# Patient Record
Sex: Female | Born: 1975 | Hispanic: Refuse to answer | Marital: Married
Health system: Midwestern US, Academic
[De-identification: ages and names within clinical notes are randomized; demographics above are authoritative.]

---

## 2019-06-23 ENCOUNTER — Encounter: Admit: 2019-06-23 | Discharge: 2019-06-23 | Primary: Family

## 2019-06-23 DIAGNOSIS — G43909 Migraine, unspecified, not intractable, without status migrainosus: Secondary | ICD-10-CM

## 2019-06-23 DIAGNOSIS — E039 Hypothyroidism, unspecified: Secondary | ICD-10-CM

## 2019-07-09 ENCOUNTER — Ambulatory Visit: Admit: 2019-07-09 | Discharge: 2019-07-10 | Primary: Family

## 2019-07-09 ENCOUNTER — Encounter: Admit: 2019-07-09 | Discharge: 2019-07-09 | Primary: Family

## 2019-07-09 DIAGNOSIS — G43909 Migraine, unspecified, not intractable, without status migrainosus: Secondary | ICD-10-CM

## 2019-07-09 DIAGNOSIS — E039 Hypothyroidism, unspecified: Secondary | ICD-10-CM
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
## 2019-07-10 DIAGNOSIS — F0781 Postconcussional syndrome: Principal | ICD-10-CM

## 2019-07-10 DIAGNOSIS — F329 Major depressive disorder, single episode, unspecified: Secondary | ICD-10-CM

## 2019-07-10 DIAGNOSIS — F419 Anxiety disorder, unspecified: Secondary | ICD-10-CM

## 2019-07-10 DIAGNOSIS — R4689 Other symptoms and signs involving appearance and behavior: Secondary | ICD-10-CM

## 2019-07-10 DIAGNOSIS — R4189 Other symptoms and signs involving cognitive functions and awareness: Secondary | ICD-10-CM

## 2019-07-10 DIAGNOSIS — G44311 Acute post-traumatic headache, intractable: Secondary | ICD-10-CM

## 2019-07-14 ENCOUNTER — Encounter: Admit: 2019-07-14 | Discharge: 2019-07-14 | Primary: Family

## 2019-07-14 DIAGNOSIS — E039 Hypothyroidism, unspecified: Secondary | ICD-10-CM

## 2019-07-21 ENCOUNTER — Encounter: Admit: 2019-07-21 | Discharge: 2019-07-21 | Primary: Family
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
## 2019-07-27 ENCOUNTER — Encounter: Admit: 2019-07-27 | Discharge: 2019-07-27 | Primary: Family

## 2019-07-27 ENCOUNTER — Ambulatory Visit: Admit: 2019-07-27 | Discharge: 2019-07-28 | Payer: Worker's Compensation | Primary: Family

## 2019-07-27 DIAGNOSIS — R4689 Other symptoms and signs involving appearance and behavior: Secondary | ICD-10-CM

## 2019-07-27 DIAGNOSIS — F0781 Postconcussional syndrome: Secondary | ICD-10-CM

## 2019-07-27 DIAGNOSIS — F419 Anxiety disorder, unspecified: Secondary | ICD-10-CM

## 2019-07-27 DIAGNOSIS — E039 Hypothyroidism, unspecified: Secondary | ICD-10-CM

## 2019-07-27 DIAGNOSIS — R4189 Other symptoms and signs involving cognitive functions and awareness: Secondary | ICD-10-CM

## 2019-07-27 MED ORDER — VENLAFAXINE 75 MG PO CP24
75 mg | ORAL_CAPSULE | Freq: Every day | ORAL | 5 refills | Status: DC
Start: 2019-07-27 — End: 2019-09-21

## 2019-07-27 MED ORDER — VENLAFAXINE 37.5 MG PO CP24
37.5 mg | ORAL_CAPSULE | Freq: Every day | ORAL | 0 refills | Status: AC
Start: 2019-07-27 — End: ?

## 2019-07-27 NOTE — Progress Notes
Subjective:       History of Present Illness  Shari Mooney is a 43 y.o. female who presents with her husband for follow up.  She reports that she stopped the trazodone after I last saw her and has not noticed any difference in her headaches or sleep.  She still uses the sumatriptan and it does help.  She continues to have daily headaches.  She continues to report difficulty sleeping due to racing thoughts.       Review of Systems   Constitutional: Positive for activity change and fatigue.   HENT: Positive for ear pain, tinnitus and trouble swallowing.    Eyes: Positive for photophobia.   Gastrointestinal: Positive for diarrhea and nausea.   Musculoskeletal: Positive for myalgias.   Neurological: Positive for dizziness, tremors, speech difficulty, weakness, light-headedness, numbness and headaches.   Psychiatric/Behavioral: Positive for agitation, confusion, decreased concentration, dysphoric mood and sleep disturbance. The patient is nervous/anxious.    All other systems reviewed and are negative.        Objective:         ??? levothyroxine (SYNTHROID) 88 mcg tablet Take 88 mcg by mouth daily 30 minutes before breakfast.   ??? sumatriptan succinate (IMITREX) 100 mg tablet Take 100 mg by mouth as Needed for Migraine symptoms. Dose may be repeated in 2 hours if needed. Max of 2 tablets in 24 hours.   ??? traZODone (DESYREL) 50 mg tablet Take 25 mg by mouth at bedtime daily.     Vitals:    07/27/19 1616   Weight: 63.5 kg (140 lb)   Height: 162.6 cm (64)   PainSc: Three     Body mass index is 24.03 kg/m???.     Physical Exam  Vitals signs reviewed.   Constitutional:       Appearance: Normal appearance.   HENT:      Head: Normocephalic and atraumatic.   Cardiovascular:      Rate and Rhythm: Normal rate and regular rhythm.   Musculoskeletal:         General: No swelling.   Neurological:      Mental Status: She is alert.           Neuro Exam:  Mental Status: A&Ox3  Speech: fluent without dysarthria CN: visual fields full to confrontation, PERRLA, EOMI, strength of facial muscles is full and symmetric, hearing intact to conversation, symmetric palate elevation, tongue midline  Motor: normal tone/bulk, strength 5/5 throughout  Coordination: normal finger to nose bilaterally  Gait: normal    Concussion Symptom Checklist Score:  Concussion Symptom Checklist 07/28/2019 07/09/2019   Headache 5 4   Balance Problem 0 1   Dizziness 0 2   Fatigue 3 4   Trouble Falling Asleep 5 6   Sleeping more than usual 4 2   Sleeping less than usual 4 5   Drowsiness 4 4   Sensitivity To Light 1 1   Sensitivity To Noise 1 2   Irritability 4 5   Sadness 4 5   Nervousness 5 6   More Emotional 4 5   Numbness or tingling and/or neck pain 0 4   Feeling Slowed Down 5 5   Feeling Mentally Foggy 4 5   Difficulty Concentrating 4 5   Difficult Remembering 4 5   Visual Problems 1 3   Neck Pain 0 1   Total Symptoms Score 63 81     Total Symptoms Score: 63    Assessment and Plan:  1. Post  concussion syndrome  AMB REFERRAL TO PSYCHOLOGY    AMB REFERRAL TO SPEECH THERAPY   2. Intractable acute post-traumatic headache     3. Cognitive and behavioral changes  AMB REFERRAL TO PSYCHOLOGY    AMB REFERRAL TO SPEECH THERAPY   4. Anxiety and depression  AMB REFERRAL TO PSYCHOLOGY     Ms. Shari Mooney has continued daily headaches.  She also has cognitive changes and anxiety/depression.    Plan:  1. Headache abortive: continue sumatriptan 100mg  q2h prn--provided a refill today  2. Headache preventives  ? Previous trials: amitriptyline (effective, side effects), topamax (ineffective, side effects), indomethacin (effective, side effects), magnesium (ineffective)  ? Current medications: none  ? Start Effexor XR 37.5mg  qday for 2 weeks and then increase to 75mg  qday.  This is to treat headaches but may also help mood.    ? Future considerations: botox or CGRP antagonists.  These could end up being the best options given her issues with side effects with multiple medications.  3. Referral speech/cognitive therapy  4. Referral for psychology    Work status: full duty, no restrictions    RTC in 8 weeks

## 2019-07-28 ENCOUNTER — Encounter: Admit: 2019-07-28 | Discharge: 2019-07-28 | Primary: Family

## 2019-07-28 DIAGNOSIS — G44311 Acute post-traumatic headache, intractable: Principal | ICD-10-CM

## 2019-07-28 DIAGNOSIS — E039 Hypothyroidism, unspecified: Secondary | ICD-10-CM

## 2019-07-28 DIAGNOSIS — F329 Major depressive disorder, single episode, unspecified: Secondary | ICD-10-CM

## 2019-07-28 MED ORDER — SUMATRIPTAN SUCCINATE 100 MG PO TAB
100 mg | ORAL_TABLET | ORAL | 5 refills | Status: DC | PRN
Start: 2019-07-28 — End: 2019-11-23

## 2019-08-13 ENCOUNTER — Encounter: Admit: 2019-08-13 | Discharge: 2019-08-13 | Primary: Family
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
## 2019-09-21 ENCOUNTER — Encounter: Admit: 2019-09-21 | Discharge: 2019-09-21 | Primary: Family

## 2019-09-21 ENCOUNTER — Ambulatory Visit: Admit: 2019-09-21 | Discharge: 2019-09-21 | Payer: Worker's Compensation | Primary: Family

## 2019-09-21 DIAGNOSIS — E039 Hypothyroidism, unspecified: Secondary | ICD-10-CM

## 2019-09-21 MED ORDER — GALCANEZUMAB-GNLM 120 MG/ML SC PNIJ
240 mg | Freq: Once | SUBCUTANEOUS | 0 refills | Status: AC
Start: 2019-09-21 — End: ?
  Filled 2019-09-29: qty 2, 30d supply, fill #1

## 2019-09-21 MED ORDER — GALCANEZUMAB-GNLM 120 MG/ML SC PNIJ
120 mg | SUBCUTANEOUS | 11 refills | Status: AC
Start: 2019-09-21 — End: ?

## 2019-09-21 NOTE — Progress Notes
A prior authorization for [employer] (med name) is pending for [employer].  The following additional information is required to complete the prior authorization:    chart notes incomplete. unable to submit pa without most recent visit notes.    The specialty pharmacy team will contact the provider's nurse to notify them of the above requirements if not complete in 24 hours.  The specialty team will submit the prior authorization once all of the information is complete.    [place] Patient Advocate  [phone]

## 2019-09-22 ENCOUNTER — Encounter: Admit: 2019-09-22 | Discharge: 2019-09-22 | Primary: Family

## 2019-09-22 DIAGNOSIS — E039 Hypothyroidism, unspecified: Secondary | ICD-10-CM

## 2019-09-25 ENCOUNTER — Encounter: Admit: 2019-09-25 | Discharge: 2019-09-25 | Primary: Family

## 2019-09-25 NOTE — Progress Notes
The Prior Authorization for [facility] was denied for [employer].  The appeal materials have been sent to the ambulatory clinical pharmacist and provider's nurse via email to complete.  Completed appeal materials should be faxed to [phone] per insurance instructions.  Please contact the specialty pharmacy with any questions regarding next steps.    [id number]    [place] Patient Advocate  [phone]

## 2019-09-28 ENCOUNTER — Encounter: Admit: 2019-09-28 | Discharge: 2019-09-28 | Primary: Family

## 2019-09-28 NOTE — Progress Notes
Pharmacy Initial Medication Assessment: Calcitonin Gene-Related Peptide (CGRP) Receptor Antagonist    Indication / Regimen   Emgality (Galcanezumab-gnlm) is being used for the appropriate indication of migraine prevention.    The regimen of 240 mg subcutaneously as a single loading dose, followed by 120 mg once monthly is planned to continue indefinitely, which is appropriate for [employer]. No renal or hepatic adjustments are required for this medication.  No additional dose titration is required.     The patient has the ability to self-administer the medication.     Therapeutic Goals: reduce frequency, severity, and duration of migraine headaches/days, avoid side effects of treatment    Baseline Characteristics  Headache days per month: 8  Headache severity: 4 to 5, but up to 10 out of 10  Current agents: venlafaxine, sumatriptan  Previously trialed agents for this indication: trazodone, amitriptyline (effective, side effects), topamax (ineffective, side effects), indomethacin (effective, side effects), magnesium (ineffective)    Allergies   Allergies   Allergen Reactions   ? Amitriptyline SEE COMMENTS     Numbness in face, feels funny   ? Morphine ITCHING   ? Propoxyphene Hcl UNKNOWN   ? Topamax [Topiramate] SEE COMMENTS     Numbness in face(?)        Past Medical History  Medical History:   Diagnosis Date   ? Hypothyroid        Vaccination Status Assessment     There is no immunization history on file for this patient.  Vaccine history was reviewed with the patient. The patient was reminded about the importance of receiving an annual influenza vaccine as indicated.     Pregnancy Status  Pregnancy status was assessed and determined to be: Female, not of child-bearing potential: education not applicable.     Medication Reconciliation  Medication reconciliation is based on the patient?s most recent med list in electronic medical record (EMR) including herbal products and OTC medications. The patients? medication list will be updated during patient education, after speaking with the patient and prior to dispensing the medication.     Home Medications    Medication Sig   galcanezumab-gnlm (EMGALITY) 120 mg/mL subcutaneous PEN Inject 2 mL under the skin once for 1 dose.   galcanezumab-gnlm (EMGALITY) 120 mg/mL subcutaneous PEN Inject 1 mL under the skin every 30 days.   levothyroxine (SYNTHROID) 88 mcg tablet Take 88 mcg by mouth daily 30 minutes before breakfast.   sumatriptan succinate (IMITREX) 100 mg tablet Take one tablet by mouth as Needed for Migraine symptoms. Dose may be repeated in 2 hours if needed. Max of 2 tablets in 24 hours.       Drug-Drug Interactions  No new significant drug-drug or drug-food interactions were identified.    Drug-Food Interactions  There are no significant drug-food interactions. This medication can be taken with or without food.      Contraindications  Loral Richard Miu has no contraindications to this medication.    Safety Precautions  Safety precautions were evaluated and discussed with patient as applicable.     Risk Evaluation and Mitigation Strategy (REMS) Assessment  No REMS program is required for this medication.     Follow-up Plan   An initial medication assessment has been completed and the patient will be contacted for education.     Ladona Mow, [facility]        Specialty Medication Counseling     Blessed Richard Miu was contacted via telephone to provide medication education on their new specialty medication.  Krissa Richard Miu was educated on the medication Emgality (galcanezumab) along with the medication class (CGRP receptor antagonist). The indication for treatment, dose, route, frequency, and duration were discussed with the patient. The patient was educated on timely administration of therapy and management of missed doses. Adherence with therapy was discussed with the patient. The patient's ability to be adherent with drug therapies was discussed and the patient was provided options for tools/resources that promote adherence to therapy.     The patient's ability to self-administer the medication was assessed.  The patient was educated on proper administration/injection technique for their therapy and verbalized acceptance and understanding. Appropriate safe-handling, storage, and disposal directions were reviewed with the patient.     Contraindications to therapy, safety precautions, and common side effects were discussed with the patient. They were instructed to seek medical attention immediately if they experience signs of an allergic reaction, including but not limited to: a rash; hives; itching; red, swollen, blistered, or peeling skin with or without fever.     Requirements of the REMS program were discussed with the patient as applicable.     A medication history and reconciliation was performed (including prescription medications, supplements, over the counter, and herbal products). The medication list was updated and the patient's current medication list is listed below.     Home Medications    Medication Sig   galcanezumab-gnlm (EMGALITY) 120 mg/mL subcutaneous PEN Inject 2 mL under the skin once for 1 dose.   galcanezumab-gnlm (EMGALITY) 120 mg/mL subcutaneous PEN Inject 1 mL under the skin every 30 days.   levothyroxine (SYNTHROID) 88 mcg tablet Take 88 mcg by mouth daily 30 minutes before breakfast.   sumatriptan succinate (IMITREX) 100 mg tablet Take one tablet by mouth as Needed for Migraine symptoms. Dose may be repeated in 2 hours if needed. Max of 2 tablets in 24 hours.        Drug-drug and drug-food interactions with the new therapy were assessed and reviewed with the patient. The patient was instructed to speak with their health care provider before starting any new drug, including prescription or over the counter, natural products, or vitamins. Pregnancy potential was reviewed with the patient. Female, not of child-bearing potential: education not applicable.     Appropriate recommended vaccinations were reviewed and discussed with the patient.    The monitoring and follow-up plan was discussed with the patient. The patient was instructed to contact their health care provider if their symptoms or health problems do not get better or if they become worse. Charae Richard Miu was instructed to contact the specialty pharmacy at [phone] if they have any questions or concerns regarding their medication therapy.     Based on the patients' preference the medication(s) will be picked up or shipped from [facility].     Priya Richard Miu was given the opportunity to ask questions and did not have any questions at this time.    Ladona Mow, PHARMD

## 2019-10-06 ENCOUNTER — Encounter: Admit: 2019-10-06 | Discharge: 2019-10-06 | Primary: Family

## 2019-10-12 ENCOUNTER — Encounter: Admit: 2019-10-12 | Discharge: 2019-10-12 | Primary: Family

## 2019-10-20 ENCOUNTER — Encounter: Admit: 2019-10-20 | Discharge: 2019-10-20 | Primary: Family

## 2019-10-26 ENCOUNTER — Encounter: Admit: 2019-10-26 | Discharge: 2019-10-26 | Primary: Family

## 2019-10-26 NOTE — Telephone Encounter
Pharmacy Medication Reassessment: Calcitonin Gene-Related Peptide (CGRP) Receptor Antagonist    Appropriateness of Therapy   Emgality (Galcanezumab-gnlm) is being used for the appropriate indication of migraine prevention.    The regimen of 240 mg subcutaneously as a single loading dose, followed by 120 mg once monthly is planned to continue indefinitely which is appropriate for Shari Mooney. No renal or hepatic adjustments are required for this medication.No additional dose titration is required.     Therapeutic Goals and Response to Therapy  Patient Assessments:     Headache days per month: her general headaches that are 3 to 5 in severity have subsided, however migraine has not improved  Headache severity: migraine has not changed out of 10  Shari Mooney reports that she is sleeping much better now also.    As the patient is achieving therapeutic benefit from the therapy the plan is to continue.     Adverse Effects  Shari Mooney is having the following adverse effect(s) some injection site reaction that subsided within 2 days. The following strategies were discussed with the patient to mitigate the adverse effect(s) ice pack, bath, acetaminophen. The adverse effect(s) were not severe enough to interfere with adherence    Adherence  Refill and adherence history were reviewed with the patient. The patient is adherent with refills and is meeting their refill goal. They report no missed doses over the past 30 days.  The patient is meeting their adherence goal. The patient was reminded about the refill process and re-educated on the importance of adherence.    Allergies   Allergies   Allergen Reactions   ? Amitriptyline SEE COMMENTS     Numbness in face, feels funny   ? Morphine ITCHING   ? Propoxyphene Hcl UNKNOWN   ? Topamax [Topiramate] SEE COMMENTS     Numbness in face(?)        Vaccination Status Assessment     There is no immunization history on file for this patient. Vaccine history was reviewed with the patient. The patient was reminded about the importance of receiving an annual influenza vaccine as indicated.     Medication Reconciliation  A medication history and reconciliation were performed (including prescription medications, supplements, over the counter, and herbal products). The medication list was updated and the patients? current medication list is included below.     Home Medications    Medication Sig   galcanezumab-gnlm (EMGALITY) 120 mg/mL subcutaneous PEN Inject 1 mL under the skin every 30 days.   levothyroxine (SYNTHROID) 88 mcg tablet Take 88 mcg by mouth daily 30 minutes before breakfast.   sumatriptan succinate (IMITREX) 100 mg tablet Take one tablet by mouth as Needed for Migraine symptoms. Dose may be repeated in 2 hours if needed. Max of 2 tablets in 24 hours.        Drug-drug and drug-food interactions between the patients? specialty medication and their medication list were assessed and reviewed with the patient. The patient was instructed to speak with their health care provider before starting any new drugs, including prescription or over the counter, natural / herbal products, or vitamins.    No new significant drug-drug or drug-food interactions were identified.    Their regimen can be taken with or without food.    Pregnancy Status  Pregnancy status was assessed and determined to be: Female, not of child-bearing potential: education not applicable.     Risk Evaluation and Mitigation Strategy (REMS) Assessment  No REMS program is required for this medication.  Follow-up Plan   Shari Mooney was given the opportunity to ask questions and did not have any questions at this time. The patient was encouraged to call with questions. The patient will be contacted to complete another reassessment within 1 year.     Shari Mooney, PHARMD

## 2019-10-27 ENCOUNTER — Encounter: Admit: 2019-10-27 | Discharge: 2019-10-27 | Primary: Family

## 2019-10-27 MED FILL — GALCANEZUMAB-GNLM 120 MG/ML SC PNIJ: 120 mg/mL | SUBCUTANEOUS | 30 days supply | Qty: 1 | Fill #1 | Status: AC

## 2019-11-23 ENCOUNTER — Encounter: Admit: 2019-11-23 | Discharge: 2019-11-23 | Primary: Family

## 2019-11-23 ENCOUNTER — Ambulatory Visit: Admit: 2019-11-23 | Discharge: 2019-11-24 | Payer: Worker's Compensation | Primary: Family

## 2019-11-23 DIAGNOSIS — G44311 Acute post-traumatic headache, intractable: Secondary | ICD-10-CM

## 2019-11-23 DIAGNOSIS — R4189 Other symptoms and signs involving cognitive functions and awareness: Secondary | ICD-10-CM

## 2019-11-23 DIAGNOSIS — E039 Hypothyroidism, unspecified: Secondary | ICD-10-CM

## 2019-11-23 DIAGNOSIS — F0781 Postconcussional syndrome: Secondary | ICD-10-CM

## 2019-11-23 DIAGNOSIS — F419 Anxiety disorder, unspecified: Secondary | ICD-10-CM

## 2019-11-23 MED ORDER — SUMATRIPTAN SUCCINATE 100 MG PO TAB
ORAL_TABLET | SUBCUTANEOUS | 5 refills | 30.00000 days | Status: AC
Start: 2019-11-23 — End: ?

## 2019-11-23 NOTE — Progress Notes
Date of Service: 11/23/2019    Subjective:             Shari Mooney is a 43 y.o. female.    History of Present Illness  Shari Mooney returns for follow up.  She reports that she is so much better.  The speech therapy is going well.  She also feels that her anxiety is better after starting EMDR.  She feels that she is getting her energy back.  Her headaches are much better.  She reports days where she does not notice a headache.  She still has some mild headaches that are stimulated by increased cognitive activity or stress.  She has only had 3 full migraines in the last month.       Review of Systems   Neurological: Positive for headaches.   Psychiatric/Behavioral: Positive for sleep disturbance. The patient is nervous/anxious.    All other systems reviewed and are negative.        Objective:         ? galcanezumab-gnlm (EMGALITY) 120 mg/mL subcutaneous PEN Inject 1 mL under the skin every 30 days.   ? levothyroxine (SYNTHROID) 88 mcg tablet Take 88 mcg by mouth daily 30 minutes before breakfast.   ? sumatriptan succinate (IMITREX) 100 mg tablet Take one tablet by mouth as Needed for Migraine symptoms. Dose may be repeated in 2 hours if needed. Max of 2 tablets in 24 hours.     Vitals:    11/23/19 1345   BP: 130/67   BP Source: Arm, Left Upper   Patient Position: Sitting   Pulse: 68   Temp: 36.6 ?C (97.8 ?F)   TempSrc: Temporal   Weight: 63.5 kg (140 lb)   Height: 162.6 cm (64)   PainSc: Zero     Body mass index is 24.03 kg/m?Marland Kitchen     Physical Exam  Vitals signs reviewed.   Constitutional:       Appearance: Normal appearance.   HENT:      Head: Normocephalic and atraumatic.   Cardiovascular:      Rate and Rhythm: Normal rate and regular rhythm.   Musculoskeletal:         General: No swelling.   Neurological:      Mental Status: She is alert.           Neuro Exam:  Mental Status: A&Ox3  Speech: fluent without dysarthria CN: visual fields full to confrontation, PERRLA, EOMI, strength of facial muscles is full and symmetric, hearing intact to conversation, symmetric palate elevation, tongue midline  Motor: normal tone/bulk, strength 5/5 throughout  Coordination: normal finger to nose bilaterally  Gait: normal    Concussion Symptom Checklist Score:  Concussion Symptom Checklist 11/23/2019 09/21/2019   Headache 2 3   Balance Problem 0 0   Dizziness 0 1   Fatigue 2 2   Trouble Falling Asleep 2 0   Sleeping more than usual 2 0   Sleeping less than usual 2 0   Drowsiness 3 2   Sensitivity To Light 0 0   Sensitivity To Noise 0 0   Irritability 0 1   Sadness 0 1   Nervousness 1 2   More Emotional 2 2   Numbness or tingling and/or neck pain 0 0   Feeling Slowed Down 0 2   Feeling Mentally Foggy 0 1   Difficulty Concentrating 0 2   Difficult Remembering 0 4   Visual Problems 0 1   Neck Pain 0 0  Total Symptoms Score 16 24     Total Symptoms Score: 16    Assessment and Plan:  1. Post concussion syndrome     2. Intractable acute post-traumatic headache     3. Cognitive and behavioral changes     4. Anxiety and depression       Shari Mooney is significantly better.  Her cognition and anxiety are certainly better.  Her headaches are also under much better control--I do suspect that these will be a chronic medical condition.    Plan:  1. Headache abortive: continue sumatriptan 100mg  q2h prn--refilled today  2. Headache preventives  ? Previous trials: amitriptyline (effective, side effects), topamax (ineffective, side effects), indomethacin (effective, side effects), magnesium (ineffective), Effexor (side effects)  ? Current medications:?Emgality 120mg  IM q28d  3. Continue?speech/cognitive therapy  4. Continue counseling and agree with EMDR.    Work status:?full duty, no restrictions  ?  RTC in 8-10 weeks

## 2019-11-24 ENCOUNTER — Encounter: Admit: 2019-11-24 | Discharge: 2019-11-24 | Primary: Family

## 2019-11-24 MED FILL — GALCANEZUMAB-GNLM 120 MG/ML SC PNIJ: 120 mg/mL | SUBCUTANEOUS | 30 days supply | Qty: 1 | Fill #2 | Status: AC

## 2019-12-17 ENCOUNTER — Encounter: Admit: 2019-12-17 | Discharge: 2019-12-17 | Primary: Family

## 2019-12-18 ENCOUNTER — Encounter: Admit: 2019-12-18 | Discharge: 2019-12-18 | Primary: Family

## 2020-01-05 ENCOUNTER — Encounter: Admit: 2020-01-05 | Discharge: 2020-01-05 | Primary: Family

## 2020-01-05 MED FILL — GALCANEZUMAB-GNLM 120 MG/ML SC PNIJ: 120 mg/mL | SUBCUTANEOUS | 30 days supply | Qty: 1 | Fill #3 | Status: AC

## 2020-01-18 ENCOUNTER — Ambulatory Visit: Admit: 2020-01-18 | Discharge: 2020-01-18 | Payer: Worker's Compensation | Primary: Family

## 2020-01-18 ENCOUNTER — Encounter: Admit: 2020-01-18 | Discharge: 2020-01-18 | Primary: Family

## 2020-01-18 DIAGNOSIS — R519 Generalized headaches: Secondary | ICD-10-CM

## 2020-01-18 DIAGNOSIS — E039 Hypothyroidism, unspecified: Secondary | ICD-10-CM

## 2020-01-19 ENCOUNTER — Encounter: Admit: 2020-01-19 | Discharge: 2020-01-19 | Primary: Family

## 2020-01-19 DIAGNOSIS — E039 Hypothyroidism, unspecified: Secondary | ICD-10-CM

## 2020-01-19 DIAGNOSIS — R519 Generalized headaches: Secondary | ICD-10-CM

## 2020-02-03 ENCOUNTER — Encounter: Admit: 2020-02-03 | Discharge: 2020-02-03 | Primary: Family

## 2020-02-04 ENCOUNTER — Encounter: Admit: 2020-02-04 | Discharge: 2020-02-04 | Primary: Family

## 2020-02-04 MED FILL — GALCANEZUMAB-GNLM 120 MG/ML SC PNIJ: 120 mg/mL | SUBCUTANEOUS | 30 days supply | Qty: 1 | Fill #4 | Status: AC

## 2020-02-15 ENCOUNTER — Encounter: Admit: 2020-02-15 | Discharge: 2020-02-15 | Primary: Family

## 2020-02-24 ENCOUNTER — Encounter: Admit: 2020-02-24 | Discharge: 2020-02-24 | Primary: Family

## 2020-02-29 ENCOUNTER — Encounter: Admit: 2020-02-29 | Discharge: 2020-02-29 | Primary: Family

## 2020-03-01 ENCOUNTER — Encounter: Admit: 2020-03-01 | Discharge: 2020-03-01 | Primary: Family

## 2020-03-02 ENCOUNTER — Encounter: Admit: 2020-03-02 | Discharge: 2020-03-02 | Primary: Family

## 2020-03-02 MED FILL — GALCANEZUMAB-GNLM 120 MG/ML SC PNIJ: 120 mg/mL | SUBCUTANEOUS | 30 days supply | Qty: 1 | Fill #5 | Status: AC

## 2020-03-14 ENCOUNTER — Encounter: Admit: 2020-03-14 | Discharge: 2020-03-14 | Primary: Family

## 2020-03-14 ENCOUNTER — Ambulatory Visit: Admit: 2020-03-14 | Discharge: 2020-03-14 | Payer: Worker's Compensation | Primary: Family

## 2020-03-14 DIAGNOSIS — F419 Anxiety disorder, unspecified: Secondary | ICD-10-CM

## 2020-03-14 DIAGNOSIS — F0781 Postconcussional syndrome: Secondary | ICD-10-CM

## 2020-03-14 DIAGNOSIS — R519 Generalized headaches: Secondary | ICD-10-CM

## 2020-03-14 DIAGNOSIS — E039 Hypothyroidism, unspecified: Secondary | ICD-10-CM

## 2020-03-14 DIAGNOSIS — G44311 Acute post-traumatic headache, intractable: Secondary | ICD-10-CM

## 2020-03-14 DIAGNOSIS — R4189 Other symptoms and signs involving cognitive functions and awareness: Secondary | ICD-10-CM

## 2020-03-14 NOTE — Progress Notes
Telephone Visit Note    Date of Service: 03/14/2020    Subjective:      Obtained patient's verbal consent to treat them and their agreement to [facility] financial policy and NPP via this telehealth visit during the [facility] Emergency.  Shari Mooney was evaluated via synchronous audio/phone       Shari Mooney is a 44 y.o. female.    History of Present Illness  Shari Mooney returns for follow up.  She reports that she is much better.  The headaches have improved quite a bit..  She is having more good days than bad.  She has a mild headache most days depending on her level of activity, but they are much more tolerable and she is able to function through them.  She has a severe migraine about once a month, but these are also reduced in severity.  She notes that the spasm she would get on her stomach with the injections has resolved.  She still gets a rash with the injections.  She feels that her concentration is still off, but also better.       Review of Systems   Constitutional: Positive for fatigue.   Psychiatric/Behavioral: Positive for confusion and decreased concentration.   All other systems reviewed and are negative.        Objective:         ? ascorbic acid (VITAMIN C PO) Take  by mouth.   ? cholecalciferol (VITAMIN D-3) 1,000 units tablet Take 1,000 Units by mouth daily.   ? galcanezumab-gnlm (EMGALITY) 120 mg/mL subcutaneous PEN Inject 1 mL under the skin every 30 days.   ? levothyroxine (SYNTHROID) 88 mcg tablet Take 88 mcg by mouth daily 30 minutes before breakfast.   ? MULTIVITAMIN PO Take  by mouth.   ? sumatriptan succinate (IMITREX) 100 mg tablet Take 1 tab at onset of Migraine. May repeat x 1 if HA persists after 2 hours. Max dose 2 tabs/24hrs.     There were no vitals filed for this visit.  There is no height or weight on file to calculate BMI.     Telehealth Patient Reported Vitals     Row Name 03/14/20 1337                Weight:  63.5 kg (140 lb) Height:  162.6 cm (64)        Pain Score:  Two        Pain Location:  HEAD              Physical Exam  No exam--phone only        Concussion Symptom Checklist Score:  Concussion Symptom Checklist 03/14/2020 01/18/2020   Headache 2 5   Balance Problem 0 0   Dizziness 0 0   Fatigue 4 4   Trouble Falling Asleep 0 3   Sleeping more than usual 0 3   Sleeping less than usual 0 3   Drowsiness 0 0   Sensitivity To Light 0 0   Sensitivity To Noise 0 0   Irritability 0 2   Sadness 0 0   Nervousness 0 4   More Emotional 0 0   Numbness or tingling and/or neck pain 0 0   Feeling Slowed Down 0 3   Feeling Mentally Foggy 0 0   Difficulty Concentrating 5 0   Difficult Remembering 4 3   Visual Problems 0 0   Neck Pain 0 0   Total Symptoms Score  15 33     Total Symptoms Score: 15    Assessment and Plan:  1. Post concussion syndrome     2. Intractable acute post-traumatic headache     3. Cognitive and behavioral changes     4. Anxiety and depression       Shari Mooney is doing much better.  Still mild daily headaches, but not as intense and severe migraines 1/month.  It is my opinion that she has reached maximal medical improvement.    Plan:  1. Headache abortive: continue sumatriptan 100mg  q2h prn  2. Headache preventives  ? Previous trials: amitriptyline (effective, side effects), topamax (ineffective, side effects), indomethacin (effective, side effects), magnesium (ineffective), Effexor (side effects)  ? Current medications:?Emgality 120mg  IM q28d  3.?Has completed?speech/cognitive therapy  4.?Has completed counseling and EMDR.    Final recommendations:  1. Continue Emgality 120mg  IM q28d and sumatriptan 100mg  as needed  2. I do not provide disability ratings, should one be needed she would need to be referred to another provider    Final work status: full duty, no restriction    At this time I am discharging Shari Mooney from concussion clinic.                         30 minutes spent on this patient's encounter with counseling and coordination of care taking >50% of the visit.  Time was spent discussing medications, prognosis, and MMI procedures.

## 2020-05-25 ENCOUNTER — Encounter: Admit: 2020-05-25 | Discharge: 2020-05-25 | Primary: Family

## 2020-05-26 ENCOUNTER — Encounter: Admit: 2020-05-26 | Discharge: 2020-05-26 | Primary: Family

## 2020-05-26 NOTE — Progress Notes
emailed Kayleen Memos, Workers [employer]ater[employer] manager regarding patients medication: [employer].I will need the processing information and claims adjuster phone number  to initiate a prior authorization.    Gaston Islam, CPhT  Pharmacy Patient Advocate, Specialty Pharmacy  [phone]

## 2020-05-27 ENCOUNTER — Encounter: Admit: 2020-05-27 | Discharge: 2020-05-27 | Primary: Family

## 2020-05-30 ENCOUNTER — Encounter: Admit: 2020-05-30 | Discharge: 2020-05-30 | Primary: Family

## 2020-05-31 ENCOUNTER — Encounter: Admit: 2020-05-31 | Discharge: 2020-05-31 | Primary: Family

## 2020-05-31 MED FILL — GALCANEZUMAB-GNLM 120 MG/ML SC PNIJ: 120 mg/mL | SUBCUTANEOUS | 30 days supply | Qty: 1 | Fill #6 | Status: AC

## 2020-05-31 NOTE — Progress Notes
The Prior Authorization for [employer] was approved for [facility] from 6.22.2021 to 7.22.2021.  The copay is $0.00.  The PA authorization is being billed though her workman's compensation claim, and will require a monthly review.  The medication will be delivered to patient's prescription address per the patient's request.The medication has been processed before and will not require education with a clinical pharmacist    Gaston Islam  Pharmacy Patient Advocate  [phone]

## 2020-06-27 ENCOUNTER — Encounter: Admit: 2020-06-27 | Discharge: 2020-06-27 | Primary: Family

## 2020-06-27 MED FILL — GALCANEZUMAB-GNLM 120 MG/ML SC PNIJ: 120 mg/mL | SUBCUTANEOUS | 30 days supply | Qty: 1 | Fill #7 | Status: AC

## 2020-07-20 ENCOUNTER — Encounter: Admit: 2020-07-20 | Discharge: 2020-07-20 | Primary: Family

## 2020-07-20 MED FILL — GALCANEZUMAB-GNLM 120 MG/ML SC PNIJ: 120 mg/mL | SUBCUTANEOUS | 30 days supply | Qty: 1 | Fill #8 | Status: AC

## 2020-07-25 ENCOUNTER — Encounter: Admit: 2020-07-25 | Discharge: 2020-07-25 | Primary: Family

## 2020-08-25 ENCOUNTER — Encounter: Admit: 2020-08-25 | Discharge: 2020-08-25 | Primary: Family

## 2020-08-25 MED FILL — GALCANEZUMAB-GNLM 120 MG/ML SC PNIJ: 120 mg/mL | SUBCUTANEOUS | 30 days supply | Qty: 1 | Fill #9 | Status: AC

## 2020-09-21 ENCOUNTER — Encounter: Admit: 2020-09-21 | Discharge: 2020-09-21 | Primary: Family

## 2020-09-21 MED ORDER — EMGALITY PEN 120 MG/ML SC PNIJ
120 mg | SUBCUTANEOUS | 11 refills | Status: AC
Start: 2020-09-21 — End: ?
  Filled 2020-09-26: qty 1, 30d supply, fill #1

## 2020-09-22 ENCOUNTER — Encounter: Admit: 2020-09-22 | Discharge: 2020-09-22 | Primary: Family

## 2020-09-26 ENCOUNTER — Encounter: Admit: 2020-09-26 | Discharge: 2020-09-26 | Primary: Family

## 2020-10-18 ENCOUNTER — Encounter: Admit: 2020-10-18 | Discharge: 2020-10-18 | Primary: Family

## 2020-10-18 NOTE — Progress Notes
First attempt to call Shari Mooney for annual reassessment to verify compliance and assess tolerance of her specialty medications (Emgality). No answer. Left voicemail asking patient to return call to pharmacist at [phone]. MyChart message sent.    Prentiss Bells, PHARMD

## 2020-10-20 ENCOUNTER — Encounter: Admit: 2020-10-20 | Discharge: 2020-10-20 | Primary: Family

## 2020-10-20 NOTE — Progress Notes
Second attempt to call Shari Mooney for annual reassessment to verify compliance and assess tolerance of her specialty medications (Emgality). No answer. Left voicemail asking patient to return call to pharmacist at [phone].      Prentiss Bells, PHARMD

## 2020-10-24 ENCOUNTER — Encounter: Admit: 2020-10-24 | Discharge: 2020-10-24 | Primary: Family

## 2020-10-24 MED FILL — EMGALITY PEN 120 MG/ML SC PNIJ: 120 mg/mL | SUBCUTANEOUS | 30 days supply | Qty: 1 | Fill #2 | Status: AC

## 2020-10-24 NOTE — Progress Notes
Pharmacy Medication Re-assessment    Indication/Regimen  The regimen of EMGALITY PEN 120 MG/ML SC PNIJ indefinitely is appropriate for Shari Mooney who has Migraine.    Renal dose adjustments are not required. Hepatic dose adjustments are not required.   Dose titration is not required.    The patient has the ability to self-administer the medication(s).    Therapeutic Goals and Monitoring  The goal of therapy is to reduce monthly migraine days and monthly acute migraine-specific medication days.    Activities of Daily Living  Missed workdays in the past 30 days: 0  Days with missed social or leisure activities in the past 30 days: 0  Days unable to complete house/yard work in the past 30 days: 0    Headache Control  Number of headache days: 2  Duration: 30 days  Severe headache symptoms: yes  Monthly migraine days: 2    The patient is making progress toward achieving their therapeutic goal. The plan is to continue current therapy.    Allergies  Allergies   Allergen Reactions   ? Amitriptyline SEE COMMENTS     Numbness in face, feels funny   ? Morphine ITCHING   ? Propoxyphene Hcl UNKNOWN   ? Topamax [Topiramate] SEE COMMENTS     Numbness in face(?)      Immunizations  Vaccine history was reviewed with the patient. Education was provided on the importance of completing vaccines, including COVID-19 vaccine and annual influenza vaccine.      There is no immunization history on file for this patient.    Medication Reconciliation  Medication history and reconciliation were performed (including prescription medications, supplements, over the counter, and herbal products). The medication list was updated and the patient's current medication list is included below.     Home Medications    Medication Sig   ascorbic acid (VITAMIN C PO) Take  by mouth.   cholecalciferol (VITAMIN D-3) 1,000 units tablet Take 1,000 Units by mouth daily.   galcanezumab-gnlm (EMGALITY PEN) 120 mg/mL subcutaneous PEN Inject 1 mL under the skin every 30 days.   galcanezumab-gnlm (EMGALITY) 120 mg/mL subcutaneous PEN Inject 2 mL under the skin once for 1 dose.   levothyroxine (SYNTHROID) 88 mcg tablet Take 88 mcg by mouth daily 30 minutes before breakfast.   MULTIVITAMIN PO Take  by mouth.   sumatriptan succinate (IMITREX) 100 mg tablet Take 1 tab at onset of Migraine. May repeat x 1 if HA persists after 2 hours. Max dose 2 tabs/24hrs.     The patient was reminded to speak with their health care provider before starting any new drug, including prescription or over the counter, natural / herbal products, or vitamins.    Drug Interactions    Drug-Drug Interactions  Drug-drug interactions were evaluated. There were not clinically significant drug-drug interactions.     Drug-Food Interactions  Drug-food interactions were evaluated. There are not clinically significant drug-food interactions. should be taken NA - not oral.    Adverse Drug Events  Adverse drug events were reviewed with the patient.  The patient does not have injection related concerns.    Significant adverse drug event(s) were not identified.    Adherence  Refill and adherence history were reviewed with the patient. The patient was educated on the importance of adherence.    Patient is adherent with refills: yes  Patient is meeting refill adherence goal: yes    Patient reported 0 missed doses over the past 30 days.   Patient is  meeting reported adherence goal.    Safety Precautions    Risk Evaluation and Mitigation (REMS) Assessment: REMS is not required for this medication.    Safety precautions were addressed and discussed with the patient as applicable.    Contraindications: Shari Mooney does not have contraindications to this medication.      Pregnancy Status: Female, not of child-bearing potential, education not applicable.    Follow-up Plan  The patient was counseled via telephone.    Shari Mooney was given the opportunity to ask questions and did not have any questions at the time. Patient was reminded of the refill process and encouraged to call with questions.    The patient will be reassessed within 1 year.    The medication(s) will be shipped from [facility].    Prentiss Bells, PHARMD

## 2020-11-17 ENCOUNTER — Encounter: Admit: 2020-11-17 | Discharge: 2020-11-17 | Primary: Family

## 2020-11-17 NOTE — Progress Notes
The Prior Authorization for emgality workmans comphensation renewal  has been initiated for [facility] via [phone].[employer] left a voice mail with Bonita Quin at [phone].  Will continue to follow.    [facility]  Pharmacy Patient Advocate  [phone]

## 2020-11-18 MED FILL — EMGALITY PEN 120 MG/ML SC PNIJ: 120 mg/mL | SUBCUTANEOUS | 30 days supply | Qty: 1 | Fill #3 | Status: AC

## 2021-01-05 ENCOUNTER — Encounter: Admit: 2021-01-05 | Discharge: 2021-01-05 | Primary: Family

## 2021-01-06 ENCOUNTER — Encounter: Admit: 2021-01-06 | Discharge: 2021-01-06 | Primary: Family

## 2021-01-06 NOTE — Progress Notes
Contacted Sharyn Patrica Duel to discuss terminated [employer] for the  medication: Emgality. Medication was last filled 12.9.2021 Left voicemail asking patient to return call to the specialty pharmacy [phone]).  Will call patient again in 3 business days if no call back received.Gaston Islam, CPhT  Pharmacy Patient Advocate, Specialty Pharmacy  [phone]

## 2021-01-09 ENCOUNTER — Encounter: Admit: 2021-01-09 | Discharge: 2021-01-09 | Primary: Family

## 2021-01-10 ENCOUNTER — Encounter: Admit: 2021-01-10 | Discharge: 2021-01-10 | Primary: Family

## 2021-01-10 NOTE — Progress Notes
Contacted [employer] and [employer] to discuss workman's compensation claim regarding their medication: Emgality. This is a second attempt, the insurance information was updated, approval has not been received.    Please see the email below sent to [email or web address]    Our mutuel  patient Shari Mooney([id number]), a review and authorization for the medication Emgality is needed for 12/2020 fill. Patients last fill of the medication was December, 12th 2021. We are expecting weather delays as the snow arrives and would appreciate filling her medication today.     I will follow up in 2 days.        Gaston Islam, CPhT  Pharmacy Patient Advocate, Specialty Pharmacy  [phone]

## 2021-01-11 ENCOUNTER — Encounter: Admit: 2021-01-11 | Discharge: 2021-01-11 | Primary: Family

## 2021-01-11 MED FILL — EMGALITY PEN 120 MG/ML SC PNIJ: 120 mg/mL | SUBCUTANEOUS | 15 days supply | Qty: 1 | Fill #4 | Status: AC

## 2021-01-13 ENCOUNTER — Encounter: Admit: 2021-01-13 | Discharge: 2021-01-13 | Primary: Family

## 2021-02-13 ENCOUNTER — Encounter: Admit: 2021-02-13 | Discharge: 2021-02-13 | Primary: Family

## 2021-02-14 MED FILL — EMGALITY PEN 120 MG/ML SC PNIJ: 120 mg/mL | SUBCUTANEOUS | 30 days supply | Qty: 1 | Fill #5 | Status: AC

## 2021-03-14 ENCOUNTER — Encounter: Admit: 2021-03-14 | Discharge: 2021-03-14 | Primary: Family

## 2021-03-14 MED FILL — EMGALITY PEN 120 MG/ML SC PNIJ: 120 mg/mL | SUBCUTANEOUS | 30 days supply | Qty: 1 | Fill #6 | Status: AC

## 2021-04-18 ENCOUNTER — Encounter: Admit: 2021-04-18 | Discharge: 2021-04-18 | Primary: Family

## 2021-04-19 ENCOUNTER — Encounter: Admit: 2021-04-19 | Discharge: 2021-04-19 | Primary: Family

## 2021-04-20 ENCOUNTER — Encounter: Admit: 2021-04-20 | Discharge: 2021-04-20 | Primary: Family

## 2021-05-16 ENCOUNTER — Encounter: Admit: 2021-05-16 | Discharge: 2021-05-16 | Primary: Family

## 2021-05-16 NOTE — Progress Notes
Contacted Diamante Patrica Duel to discuss mediation allergies that need updated before we can refill her Emgality.. Left voicemail asking patient to return call to the specialty pharmacy [phone]).     Fredda Hammed, CPhT  Pharmacy Patient Advocate, Specialty Pharmacy  [phone]

## 2021-05-17 ENCOUNTER — Encounter: Admit: 2021-05-17 | Discharge: 2021-05-17 | Primary: Family

## 2021-05-17 MED FILL — EMGALITY PEN 120 MG/ML SC PNIJ: 120 mg/mL | SUBCUTANEOUS | 30 days supply | Qty: 1 | Fill #7 | Status: AC

## 2021-05-25 ENCOUNTER — Encounter: Admit: 2021-05-25 | Discharge: 2021-05-25 | Primary: Family

## 2021-06-09 ENCOUNTER — Encounter: Admit: 2021-06-09 | Discharge: 2021-06-09 | Primary: Family

## 2021-06-09 MED FILL — EMGALITY PEN 120 MG/ML SC PNIJ: 120 mg/mL | SUBCUTANEOUS | 30 days supply | Qty: 1 | Fill #8 | Status: AC

## 2021-07-06 ENCOUNTER — Encounter: Admit: 2021-07-06 | Discharge: 2021-07-06 | Primary: Family

## 2021-07-06 MED FILL — EMGALITY PEN 120 MG/ML SC PNIJ: 120 mg/mL | SUBCUTANEOUS | 30 days supply | Qty: 1 | Fill #9 | Status: AC

## 2021-08-10 ENCOUNTER — Encounter: Admit: 2021-08-10 | Discharge: 2021-08-10 | Primary: Family

## 2021-08-10 MED FILL — EMGALITY PEN 120 MG/ML SC PNIJ: 120 mg/mL | SUBCUTANEOUS | 30 days supply | Qty: 1 | Fill #10 | Status: AC

## 2021-09-11 ENCOUNTER — Encounter: Admit: 2021-09-11 | Discharge: 2021-09-11 | Primary: Family

## 2021-09-12 MED FILL — EMGALITY PEN 120 MG/ML SC PNIJ: 120 mg/mL | SUBCUTANEOUS | 30 days supply | Qty: 1 | Fill #11 | Status: AC

## 2021-10-09 ENCOUNTER — Encounter: Admit: 2021-10-09 | Discharge: 2021-10-09 | Primary: Family

## 2021-10-09 MED ORDER — EMGALITY PEN 120 MG/ML SC PNIJ
120 mg | SUBCUTANEOUS | 11 refills
Start: 2021-10-09 — End: ?

## 2021-10-10 ENCOUNTER — Encounter: Admit: 2021-10-10 | Discharge: 2021-10-10 | Primary: Family

## 2021-10-10 NOTE — Progress Notes
Patient is no longer taking the specialty medication Emgality due to no longer following with [facility]. The physician/APP is aware. Thus, the patient will be removed from the specialty pharmacy patient management program.     The medication has been removed from the patient's active medication list.  Should the patient's care team wish to restart the medication, a new prescription will need to be sent to [facility].

## 2021-10-17 ENCOUNTER — Encounter: Admit: 2021-10-17 | Discharge: 2021-10-17 | Primary: Family

## 2021-11-24 ENCOUNTER — Encounter: Admit: 2021-11-24 | Discharge: 2021-11-24 | Primary: Family

## 2021-12-13 ENCOUNTER — Encounter: Admit: 2021-12-13 | Discharge: 2021-12-13 | Primary: Family

## 2021-12-13 MED ORDER — EMGALITY PEN 120 MG/ML SC PNIJ
120 mg | SUBCUTANEOUS | 5 refills | Status: CN
Start: 2021-12-13 — End: ?

## 2021-12-13 NOTE — Telephone Encounter
Work comp reached out requesting from Dr. Virgina Evener a 6 month supply of Emgality until pt can get established with a PCP. Dr Rippee agreed to provide script. Sent to [facility] specialty pharmacy.

## 2021-12-14 ENCOUNTER — Encounter: Admit: 2021-12-14 | Discharge: 2021-12-14 | Primary: Family

## 2021-12-16 ENCOUNTER — Encounter: Admit: 2021-12-16 | Discharge: 2021-12-16 | Primary: Family

## 2021-12-19 ENCOUNTER — Encounter: Admit: 2021-12-19 | Discharge: 2021-12-19 | Primary: Family

## 2021-12-21 ENCOUNTER — Encounter: Admit: 2021-12-21 | Discharge: 2021-12-21 | Primary: Family

## 2021-12-21 MED FILL — EMGALITY PEN 120 MG/ML SC PNIJ: 120 mg/mL | SUBCUTANEOUS | 30 days supply | Qty: 1 | Fill #1 | Status: AC

## 2021-12-21 NOTE — Progress Notes
Shari Mooney has opt'd out of the specialty pharmacy patient management program. Shari Mooney is a continuation of therapy and she declined education.    Prentiss Bells, PHARMD

## 2021-12-26 ENCOUNTER — Encounter: Admit: 2021-12-26 | Discharge: 2021-12-26 | Primary: Family

## 2022-01-17 ENCOUNTER — Encounter: Admit: 2022-01-17 | Discharge: 2022-01-17 | Primary: Family

## 2022-01-18 ENCOUNTER — Encounter: Admit: 2022-01-18 | Discharge: 2022-01-18 | Primary: Family

## 2022-01-18 MED FILL — EMGALITY PEN 120 MG/ML SC PNIJ: 120 mg/mL | SUBCUTANEOUS | 30 days supply | Qty: 1 | Fill #2 | Status: AC

## 2022-01-24 ENCOUNTER — Encounter: Admit: 2022-01-24 | Discharge: 2022-01-24 | Primary: Family

## 2022-03-01 ENCOUNTER — Encounter: Admit: 2022-03-01 | Discharge: 2022-03-01 | Primary: Family

## 2022-03-01 NOTE — Progress Notes
Contacted Shari Mooney's workman's comp plan to discuss coverage for [name] The [employer] representative stated that it needs to be sent over the to adjuster and they will call the specialty pharmacy at [phone] once approved.    [name]  Pharmacy Patient Advocate, Specialty Pharmacy  [phone]

## 2022-03-02 ENCOUNTER — Encounter: Admit: 2022-03-02 | Discharge: 2022-03-02 | Primary: Family

## 2022-03-02 MED FILL — EMGALITY PEN 120 MG/ML SC PNIJ: 120 mg/mL | SUBCUTANEOUS | 30 days supply | Qty: 1 | Fill #3 | Status: AC

## 2022-05-14 ENCOUNTER — Encounter: Admit: 2022-05-14 | Discharge: 2022-05-14 | Primary: Family

## 2022-05-14 NOTE — Telephone Encounter
Shari Mooney from [employer] called wanting to know if the pt would need to have a follow up visit to get refills on her medications. [phone]) [phone]    -Emgality 120mg  IM q28d  -Sumatriptan 100mg  PRN    Contacted back and informed her that pt was discharged from our office and would need to get additional refills from her PCP or whomever has taken over her care.

## 2022-05-30 ENCOUNTER — Encounter: Admit: 2022-05-30 | Discharge: 2022-05-30 | Primary: Family

## 2024-01-24 ENCOUNTER — Encounter: Admit: 2024-01-24 | Discharge: 2024-01-24 | Primary: Family

## 2024-01-24 MED ORDER — EMGALITY PEN 120 MG/ML SC PNIJ
120 mg | SUBCUTANEOUS | 5 refills
Start: 2024-01-24 — End: ?

## 2024-01-24 NOTE — Telephone Encounter
 Received refill request for Emgality 120 mg/ml;Marland Kitchen Medication included in 11/23/2019 LOV plan of care. Patient was discharged and has not been seen since.  Rx denied electronically. Dr. Virgina Evener to [place]. Patient to get this refill from his PCP.   Philis Fendt, RN
